# Patient Record
Sex: Male | Born: 1990 | Race: Black or African American | Hispanic: No | Marital: Single | Smoking: Never smoker
Health system: Southern US, Community
[De-identification: ages and names within clinical notes are randomized; demographics above are authoritative.]

## PROBLEM LIST (undated history)

## (undated) ENCOUNTER — Ambulatory Visit: Payer: Self-pay | Source: Home / Self Care

---
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
## 2011-06-09 ENCOUNTER — Emergency Department: Payer: Self-pay | Admitting: Internal Medicine

## 2012-04-12 ENCOUNTER — Emergency Department (HOSPITAL_COMMUNITY)
Admission: EM | Admit: 2012-04-12 | Discharge: 2012-04-12 | Disposition: A | Discharge status: Home / Self Care | Payer: Self-pay | Source: Home / Self Care | Attending: Family Medicine | Admitting: Family Medicine

## 2012-04-12 ENCOUNTER — Encounter (HOSPITAL_COMMUNITY): Payer: Self-pay | Admitting: Emergency Medicine

## 2012-04-12 DIAGNOSIS — K649 Unspecified hemorrhoids: Secondary | ICD-10-CM

## 2012-04-12 MED ORDER — HYDROCORTISONE ACE-PRAMOXINE 2.5-1 % RE CREA: TOPICAL_CREAM | Freq: Three times a day (TID) | RECTAL | Status: AC

## 2012-04-12 MED ORDER — POLYETHYLENE GLYCOL 3350 17 GM/SCOOP PO POWD: 17.0000 g | Freq: Every day | ORAL | Status: AC

## 2012-04-12 MED ORDER — PSYLLIUM 0.52 G PO CAPS: 0.5200 g | ORAL_CAPSULE | Freq: Every day | ORAL | Status: AC

## 2012-04-12 NOTE — ED Notes (Signed)
Pt c/o rectal bleeding since 9/13, no diarrhea or constipation. Pt denies injury.

## 2012-04-12 NOTE — ED Provider Notes (Signed)
History     CSN: [id number]  Arrival date & time 04/12/12  1517   First MD Initiated Contact with Patient 04/12/12 1531      Chief Complaint  Patient presents with  . Rectal Bleeding    (Consider location/radiation/quality/duration/timing/severity/associated sxs/prior treatment) HPI Comments: 21 year old male with no significant past medical history. Here complaining of rectal burning sensation and bleeding for 3 days. Denies straining or hard stools. He stated he started to see blood stained tissue paper after wiping. Denies abdominal pain nausea vomiting or diarrhea. Denies rectal trauma. States pain is 2-3/10. No family or personal history of Chron's or ulcerative colitis. No h/o heavy drinking or hepatic cirrhosis. Works loading and unloading trucks.      History reviewed. No pertinent past medical history.  History reviewed. No pertinent past surgical history.  History reviewed. No pertinent family history.  History  Substance Use Topics  . Smoking status: Never Smoker   . Smokeless tobacco: Not on file  . Alcohol Use:       Review of Systems  Constitutional: Negative for fever, chills and appetite change.       10 systems reviewed and  pertinent negative and positive symptoms are as per HPI.     Gastrointestinal: Positive for blood in stool, hematochezia and rectal pain. Negative for vomiting, abdominal pain, diarrhea and constipation.       As per HPI  Skin: Negative for rash.  Neurological: Negative for dizziness and headaches.  All other systems reviewed and are negative.    Allergies  Review of patient's allergies indicates no known allergies.  Home Medications   Current Outpatient Rx  Name Route Sig Dispense Refill  . HYDROCORTISONE ACE-PRAMOXINE 2.5-1 % RE CREA Rectal Place rectally 3 (three) times daily. 30 g 0  . POLYETHYLENE GLYCOL 3350 PO POWD Oral Take 17 g by mouth daily. 255 g 0  . PSYLLIUM 0.52 G PO CAPS Oral Take 1 capsule (0.52 g total) by  mouth daily. 30 capsule 0    BP 117/61  Pulse 60  Temp 98.6 F (37 C) (Oral)  Resp 17  SpO2 100%  Physical Exam  Nursing note and vitals reviewed. Constitutional: He is oriented to person, place, and time. He appears well-developed and well-nourished. No distress.  HENT:  Head: Normocephalic and atraumatic.  Eyes: No scleral icterus.  Cardiovascular: Normal heart sounds.   Pulmonary/Chest: Breath sounds normal.  Abdominal: Soft. He exhibits no distension. There is no tenderness.       No HSM  Genitourinary: Prostate normal. Rectal exam shows external hemorrhoid and tenderness. Rectal exam shows no internal hemorrhoid, no fissure, no mass and anal tone normal.       Rectal area: there is an inflamed external hemorrhoid at 7 o'clock level with signs of skin abrasion and obvious bleeding point with small superficial clot at the tip. Minimally tender to palpation. No hard consistency. Does not appear thrombosed.     Neurological: He is alert and oriented to person, place, and time.  Skin: No rash noted.    ED Course  Procedures (including critical care time)  Labs Reviewed - No data to display No results found.   1. Bleeding hemorrhoid       MDM  Abraded external hemorrhoid. Not thrombosed. Treated conservatively with pramoxine cream. MiraLax, psyllium and sitz bath. Supportive/preventive care instructions discussed with patient and provided in writing. Asked to return if worsening symptoms despite following treatment.        Sharley Keeler Moreno-Coll,  MD 04/14/12 2226

## 2012-04-19 ENCOUNTER — Emergency Department (INDEPENDENT_AMBULATORY_CARE_PROVIDER_SITE_OTHER)
Admission: EM | Admit: 2012-04-19 | Discharge: 2012-04-19 | Disposition: A | Discharge status: Home / Self Care | Payer: 59 | Source: Home / Self Care

## 2012-04-19 ENCOUNTER — Encounter (HOSPITAL_COMMUNITY): Payer: Self-pay | Admitting: Emergency Medicine

## 2012-04-19 DIAGNOSIS — K644 Residual hemorrhoidal skin tags: Secondary | ICD-10-CM

## 2012-04-19 NOTE — ED Notes (Addendum)
Pt in for recheck of hemorrhoids and needs a release note to return to work. Pt states that he is doing a lot better.

## 2012-04-19 NOTE — ED Provider Notes (Signed)
Medical screening examination/treatment/procedure(s) were performed by non-physician practitioner and as supervising physician I was immediately available for consultation/collaboration.  Ugochi Henzler   Saranya Harlin, MD 04/19/12 1239 

## 2012-04-19 NOTE — ED Provider Notes (Signed)
History     CSN: [id number]  Arrival date & time 04/19/12  1048   None     Chief Complaint  Patient presents with  . Follow-up    hemorrhoids    (Consider location/radiation/quality/duration/timing/severity/associated sxs/prior treatment) HPI Comments: 21 year old male presents for followup for external hemorrhoids. He was seen at the urgent care center here one week ago. He was prescribed topical treatment and oral Colace. He states he is feeling much better much less discomfort and pain and only trace, scant intermittent bleeding.   History reviewed. No pertinent past medical history.  History reviewed. No pertinent past surgical history.  History reviewed. No pertinent family history.  History  Substance Use Topics  . Smoking status: Never Smoker   . Smokeless tobacco: Not on file  . Alcohol Use:       Review of Systems  Constitutional: Negative.   Respiratory: Negative.   Gastrointestinal: Positive for vomiting and anal bleeding. Negative for nausea, abdominal pain, diarrhea and rectal pain.  Genitourinary: Negative.   Musculoskeletal: Negative.        As per HPI  Skin: Negative.   Neurological: Negative for dizziness, weakness, numbness and headaches.    Allergies  Review of patient's allergies indicates no known allergies.  Home Medications   Current Outpatient Rx  Name Route Sig Dispense Refill  . HYDROCORTISONE ACE-PRAMOXINE 2.5-1 % RE CREA Rectal Place rectally 3 (three) times daily. 30 g 0  . PSYLLIUM 0.52 G PO CAPS Oral Take 1 capsule (0.52 g total) by mouth daily. 30 capsule 0    BP 122/72  Pulse 65  Temp 98.7 F (37.1 C) (Oral)  Resp 17  SpO2 98%  Physical Exam  Constitutional: He is oriented to person, place, and time. He appears well-developed and well-nourished.  HENT:  Head: Normocephalic and atraumatic.  Eyes: EOM are normal. Left eye exhibits no discharge.  Neck: Normal range of motion. Neck supple.  Genitourinary:       Anal  examination reveals a small shrinking external hemorrhoid. No evidence of bleeding. It is not obstructing the anal outlet. Is not tense.  Musculoskeletal: He exhibits no edema and no tenderness.  Neurological: He is alert and oriented to person, place, and time. No cranial nerve deficit.  Skin: Skin is warm and dry.  Psychiatric: He has a normal mood and affect.    ED Course  Procedures (including critical care time)  Labs Reviewed - No data to display No results found.   1. External hemorrhoid       MDM  Continue to use the oral and topical medications previously prescribed.  No straining. 3 more days out of work.          Hayden Rasmussen, NP 04/19/12 1238

## 2012-06-07 ENCOUNTER — Emergency Department (HOSPITAL_COMMUNITY)
Admission: EM | Admit: 2012-06-07 | Discharge: 2012-06-07 | Disposition: A | Discharge status: Home / Self Care | Payer: 59 | Source: Home / Self Care | Attending: Family Medicine | Admitting: Family Medicine

## 2012-06-07 ENCOUNTER — Encounter (HOSPITAL_COMMUNITY): Payer: Self-pay | Admitting: Emergency Medicine

## 2012-06-07 DIAGNOSIS — M65839 Other synovitis and tenosynovitis, unspecified forearm: Secondary | ICD-10-CM

## 2012-06-07 DIAGNOSIS — M778 Other enthesopathies, not elsewhere classified: Secondary | ICD-10-CM

## 2012-06-07 MED ORDER — DICLOFENAC POTASSIUM 50 MG PO TABS: 50.0000 mg | ORAL_TABLET | Freq: Three times a day (TID) | ORAL | Status: DC

## 2012-06-07 NOTE — ED Notes (Signed)
Pt states that works at [employer] and does a lot of lifting boxes 20+ pounds.   C/o left wrist pain that is an achy sensation that radiates from base of wrist up thru out fingers.   Symptoms started on 10/20 ; gradual on set

## 2012-06-07 NOTE — ED Provider Notes (Signed)
History     CSN: [id number]  Arrival date & time 06/07/12  1002   First MD Initiated Contact with Patient 06/07/12 1003      Chief Complaint  Patient presents with  . Wrist Pain    constant achy feeling radiating from wrist thru out fingers; left wrist    (Consider location/radiation/quality/duration/timing/severity/associated sxs/prior treatment) Patient is a 21 y.o. male presenting with wrist pain. The history is provided by the patient.  Wrist Pain This is a new problem. The current episode started more than 2 days ago. The problem has not changed since onset.Associated symptoms comments: Unloads boxes at work, repetitively.. The symptoms are aggravated by twisting and bending.    History reviewed. No pertinent past medical history.  History reviewed. No pertinent past surgical history.  History reviewed. No pertinent family history.  History  Substance Use Topics  . Smoking status: Never Smoker   . Smokeless tobacco: Not on file  . Alcohol Use:       Review of Systems  Constitutional: Negative.   Musculoskeletal: Positive for arthralgias. Negative for myalgias and joint swelling.    Allergies  Review of patient's allergies indicates no known allergies.  Home Medications   Current Outpatient Rx  Name  Route  Sig  Dispense  Refill  . DICLOFENAC POTASSIUM 50 MG PO TABS   Oral   Take 1 tablet (50 mg total) by mouth 3 (three) times daily.   30 tablet   0   . PSYLLIUM 0.52 G PO CAPS   Oral   Take 1 capsule (0.52 g total) by mouth daily.   30 capsule   0     BP 127/73  Pulse 62  Temp 98.6 F (37 C) (Oral)  Resp 17  SpO2 95%  Physical Exam  Nursing note and vitals reviewed. Constitutional: He is oriented to person, place, and time. He appears well-developed and well-nourished.  Musculoskeletal: He exhibits tenderness.       Arms: Neurological: He is alert and oriented to person, place, and time.  Skin: Skin is warm and dry.    ED Course    Procedures (including critical care time)  Labs Reviewed - No data to display No results found.   1. Tendonitis of wrist, left       MDM          Linna Hoff, MD 06/07/12 1029

## 2012-06-17 ENCOUNTER — Encounter (HOSPITAL_COMMUNITY): Payer: Self-pay | Admitting: *Deleted

## 2012-06-17 ENCOUNTER — Emergency Department (HOSPITAL_COMMUNITY)
Admission: EM | Admit: 2012-06-17 | Discharge: 2012-06-17 | Disposition: A | Discharge status: Home / Self Care | Payer: 59 | Source: Home / Self Care | Attending: Family Medicine | Admitting: Family Medicine

## 2012-06-17 DIAGNOSIS — M65849 Other synovitis and tenosynovitis, unspecified hand: Secondary | ICD-10-CM

## 2012-06-17 DIAGNOSIS — M778 Other enthesopathies, not elsewhere classified: Secondary | ICD-10-CM

## 2012-06-17 MED ORDER — DICLOFENAC POTASSIUM 50 MG PO TABS: 50.0000 mg | ORAL_TABLET | Freq: Three times a day (TID) | ORAL | Status: DC

## 2012-06-17 NOTE — ED Notes (Signed)
Pt  Reports  Symptoms  Of  r  Wrist  Pain  For  sev  Weeks        He  denys  specefic injury  Yet  Reports  Pain is  Worse  On  Certain movements  And  Or  posistion

## 2012-06-17 NOTE — ED Provider Notes (Signed)
History     CSN: [id number]  Arrival date & time 06/17/12  4098   First MD Initiated Contact with Patient 06/17/12 [phone]      Chief Complaint  Patient presents with  . Wrist Pain    (Consider location/radiation/quality/duration/timing/severity/associated sxs/prior treatment) HPI Comments: 21 year old right-handed male with history of left hand and wrist tendinitis diagnosed 10 days ago here. Comes complaining of similar symptoms on the right hand. Patient described pain in the back of his wrist over the base of the right thumb. 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 Had diclofenac prescribed for similar symptoms in the left hand. Denies taking this medication in the last 2 days. Has used a wrist splint on that left hand with improvement. Not using a wrist brace or splint on the right hand currently. Denies fever or chills. No other joints Involvement. Denies joint swelling, no skin discoloration of the hands or at the tip of his fingers.    History reviewed. No pertinent past medical history.  History reviewed. No pertinent past surgical history.  No family history on file.  History  Substance Use Topics  . Smoking status: Never Smoker   . Smokeless tobacco: Not on file  . Alcohol Use: No      Review of Systems  Constitutional: Negative for fever and chills.  Musculoskeletal: Negative for myalgias, joint swelling and arthralgias.       Wrist pain as per HPI  Skin: Negative for color change and rash.  All other systems reviewed and are negative.    Allergies  Review of patient's allergies indicates no known allergies.  Home Medications   Current Outpatient Rx  Name  Route  Sig  Dispense  Refill  . DICLOFENAC POTASSIUM 50 MG PO TABS   Oral   Take 1 tablet (50 mg total) by mouth 3 (three) times daily.   30 tablet   0   . PSYLLIUM 0.52 G PO CAPS   Oral   Take 1 capsule (0.52 g total) by mouth daily.   30 capsule   0      BP 145/79  Pulse 58  Temp 98.5 F (36.9 C) (Oral)  Resp 16  SpO2 100%  Physical Exam  Nursing note and vitals reviewed. Constitutional: He is oriented to person, place, and time. He appears well-developed and well-nourished. No distress.  HENT:  Head: Normocephalic and atraumatic.  Eyes: Conjunctivae normal are normal. No scleral icterus.  Neck: No thyromegaly present.  Cardiovascular: Normal heart sounds.   Pulmonary/Chest: Breath sounds normal.  Musculoskeletal:       Right wrist: No obvious deformity, swelling or erythema. Patient able to make a fist, abduct and adduct digits with reported minimal discomfort. Reported pain worse with  thumb opposition to other digits. Tenderness over the radial dorsal area at base of the thumb. No tenderness to percussion over volar wrist surface. Intact 2 point discrimination in the dorsal and palmar aspect of the hand and fingers. Phalen's test. Positive Finkelstain's test. Patent radial and ulnar pulses with brisk cap refill at the tip of the fingers. Patient reported pain with hand grip. Strength impressed normal despite discomfort.    Neurological: He is alert and oriented to person, place, and time.  Skin: No rash noted. He is not diaphoretic. No erythema. No pallor.    ED Course  Procedures (including critical care time)  Labs Reviewed - No data to display No results found.  1. Tendinitis of right wrist       MDM  Impress Quervain's tenosynovitis. Refilled diclofenac. Placed on a right wrist splint with thumb spica. Recommended rehabilitation exercises. Asked to followup with sports medicine if persistent or improving symptoms despite following treatment.        Sharin Grave, MD 06/19/12 1427

## 2013-08-26 ENCOUNTER — Emergency Department (INDEPENDENT_AMBULATORY_CARE_PROVIDER_SITE_OTHER): Payer: 59

## 2013-08-26 ENCOUNTER — Encounter (HOSPITAL_COMMUNITY): Payer: Self-pay | Admitting: Emergency Medicine

## 2013-08-26 ENCOUNTER — Emergency Department (HOSPITAL_COMMUNITY)
Admission: EM | Admit: 2013-08-26 | Discharge: 2013-08-26 | Disposition: A | Discharge status: Home / Self Care | Payer: 59 | Source: Home / Self Care | Attending: Emergency Medicine | Admitting: Emergency Medicine

## 2013-08-26 DIAGNOSIS — S8390XA Sprain of unspecified site of unspecified knee, initial encounter: Secondary | ICD-10-CM

## 2013-08-26 DIAGNOSIS — IMO0002 Reserved for concepts with insufficient information to code with codable children: Secondary | ICD-10-CM

## 2013-08-26 DIAGNOSIS — S93409A Sprain of unspecified ligament of unspecified ankle, initial encounter: Secondary | ICD-10-CM

## 2013-08-26 MED ORDER — HYDROCODONE-ACETAMINOPHEN 5-325 MG PO TABS: ORAL_TABLET | ORAL | Status: DC

## 2013-08-26 MED ORDER — MELOXICAM 15 MG PO TABS: 15.0000 mg | ORAL_TABLET | Freq: Every day | ORAL | Status: DC

## 2013-08-26 MED ORDER — HYDROCODONE-ACETAMINOPHEN 5-325 MG PO TABS
2.0000 | ORAL_TABLET | Freq: Once | ORAL | Status: AC
  Administered 2013-08-26: 2 via ORAL

## 2013-08-26 MED ORDER — HYDROCODONE-ACETAMINOPHEN 5-325 MG PO TABS
ORAL_TABLET | ORAL | Status: AC
  Filled 2013-08-26: qty 2

## 2013-08-26 NOTE — Discharge Instructions (Signed)
An ankle sprain is a tear or stretch to one or more of the ligaments that surround and support the ankle joint.  This can take time to improve, but by following some of the hints below, you can speed the healing time. ° °For the first 72 hours, follow the principles of RICE (Rest, Ice, Compression, and Elevation). °· Rest--Stay off the ankle as much as possible.  If you have been provided with crutches, use them. °· Ice--Apply ice (crushed ice in a zip lock bag, frozen peas or corn, or one of the commercial blue gel ice packs that you put in the freezer).  Apply every 2 or 3 hours if possible, while awake.  If you are wearing a brace or splint, you may apply the ice right over the splint.  If you do not have a splint or brace, place a moist washcloth or towel between the ice and your skin to avoid damage to your skin.  You may leave the ice in place for 10 to 15 minutes.  Alternatively, you can do ice massage by filling a paper cup half full of water, inserting a popsicle stick and freezing it.  This gives you a chunk of ice on a stick with which you can massage the painful area. °· Compression--If you have been provided with an ankle brace, wear it whenever you are on your feet.  This means you can take it off at night when you sleep or when you shower or bathe.  Otherwise, it should be on your ankle.  If you were not given a brace, you should wear a 3 or 4 inch Ace wrap.  Wind it around your ankle in a figure 8 pattern at full stretch.  You may need to rewrap the ankle several times a day to keep the pressure up.  If your toes feel numb, painful, or look pale or blue, loosen it up a little since this could be a sign of circulatory impairment.  An Ace wrap loses its elasticity after 3 or 4 days of wear, so you will need to replace it. °· Elevation--Elevate your ankle above heart level every chance you get.  This means propping it up on several pillows. ° °Use of over the counter pain meds can be of help.  Tylenol  (or acetaminophen) is the safest to use.  It often helps to take this regularly.  You can take up to 2 325 mg tablets 5 times daily, but it best to start out much lower that that, perhaps 2 325 mg tablets twice daily, then increase from there. People who are on the blood thinner warfarin have to be careful about taking high doses of Tylenol.  For people who are able to tolerate them, ibuprofen and naproxyn can also help with the pain.  You should discuss these agents with your physician before taking them.  People with chronic kidney disease, hypertension, peptic ulcer disease, and reflux can suffer adverse side effects. They should not be taken with warfarin. The maximum dosage of ibuprofen is 800 mg 3 times daily with meals.  The maximum dosage of naprosyn is 2 and 1/2 tablets twice daily with food, but again, start out low and gradually increase the dose until adequate pain relief is achieved. Ibuprofen and naprosyn should always be taken with food. ° °After the first 72 hours, it's time to start mobilizing the ankle.  Ankle sprains heal quicker with gentle and gradual remobilization.  You may now begin to bear more   weight as pain allows.  If you are using crutches, you may begin to wean off the crutches if you can.  Don't start to do any strenuous exercises such as running or sports until cleared by a physician.  It is best to start out with some simple exercises such as those described below and then gradually build up.  Do the exercises twice daily followed by ice for 10 minutes.  Continue to wear your brace or Ace wrap until the ankle has completely healed plus one or 2 more weeks. ° ° °ANKLE CIRCLES °  °Sit on the floor with your legs in front of you. Move your ankle from side to side, up and down, and around in circles. Do five to 10 circles in each direction at least three times a day. ° °ALPHABET LETTERS °  °Using your big toe as a “pencil”, try to write the letters of the alphabet in the air. Do the  entire alphabet two or three times. ° °TOE RAISES °  °Pull your toes back toward you while keeping your knee as straight as you can. Hold for 15 seconds. Do this 10 times. ° °HEEL RAISES °  °Point your toes away from you while keeping your knee as straight as you can. Hold for 15 seconds. Do this 10 times. ° °IN AND OUT °  °Turn your foot inward until you can't turn it anymore and hold for 15 seconds. Straighten your leg again. Turn it outward until you can't turn it anymore and hold for 15 seconds. Do this 10 times in both directions. ° °RESISTED IN AND OUT °  °Sit on a chair with your leg straight in front of you. Tie a large elastic exercise band together at one end to make a knot. Wrap the knot end of the band around a chair leg and the other end around the bottom of your injured foot. Keep your heel on the ground and slide your foot outward and hold for 10 seconds. Put your foot in front of you again. Slide your foot inward and hold for 10 seconds. Repeat at least 10 times each direction two or three times a day. ° °STEP-UP °  °Put your injured foot on the first step of a staircase and your uninjured foot on the ground. Slowly straighten the knee of your injured leg while lifting your uninjured foot off of the ground. Slowly put your uninjured foot back on the ground. Do this three to five times at least three times a day. ° °SITTING AND STANDING HEEL RAISES °  °Sit in a chair with your injured foot on the ground. Slowly raise the heel of your injured foot while keeping your toes on the ground. Return the heel to the floor. Repeat 10 times at least two or three times a day. As you get stronger, you can stand on your injured foot instead of sitting in a chair and raise the heel. Your uninjured foot should always stay on the ground. ° °BALANCE EXERCISES °  °Stand and place a chair next to your uninjured leg to balance you. At first, stand on the injured foot for only 30 seconds. You can slowly increase this to up  to three minutes at a time. Repeat at least three times a day. For more difficulty, repeat with your eyes closed. ° °You will need to continue these exercises until your ankle is completely pain free, then for 1 to 2 more weeks.  If your ankle does not   seem to be making much progress after 2 weeks, it is best to return to have it rechecked, although an ankle sprain can take up to 6 weeks to heal. ° ° ° °Knee pain can be caused by many conditions:  Osteoarthritis, gout, bursitis, tendonitis, cartiledge damage, condromalacia patella, patellofemoral syndrome, and ligament sprain to name just a few.  Often some simple conservative measures can help alleviate the pain. ° °Do not do the following: °· Avoid squatting and doing deep knee bends.  This puts too much of load on your cartiledges and tendons.  If you do a knee bend, go only half way down, flexing your knee no more than 90 degrees. ° °Do the following: °· If you are overweight or obese, lose weight.  This makes for a lot less load on your knee joints. °· If you use tobacco, quit.  Nicotine causes spasm of the small arteries, decreases blood flow, and impairs your body's normal ability to repair damage. °· If your knee is acutely inflamed, use the principles of RICE (rest, ice, compression, and elevation). °· Wearing a knee brace can help.  These are usually made of neoprene and can be purchased over the counter at the drug store. °· Use of over the counter pain meds can be of help.  Tylenol (or acetaminophen) is the safest to use.  It often helps to take this regularly.  You can take up to 2 325 mg tablets 5 times daily, but it best to start out much lower that that, perhaps 2 325 mg tablets twice daily, then increase from there. People who are on the blood thinner warfarin have to be careful about taking high doses of Tylenol.  For people who are able to tolerate them, ibuprofen and naproxyn can also help with the pain.  You should discuss these agents with your  physician before taking them.  People with chronic kidney disease, hypertension, peptic ulcer disease, and reflux can suffer adverse side effects. They should not be taken with warfarin. The maximum dosage of ibuprofen is 800 mg 3 times daily with meals.  The maximum dosage of naprosyn is 2 and 1/2 tablets twice daily with food, but again, start out low and gradually increase the dose until adequate pain relief is achieved. Ibuprofen and naprosyn should always be taken with food. °· People with cartiledge injury or osteoarthritis may find glucosamine to be helpful.  This is an over-the-counter supplement that helps nourish and repair cartiledge.  The dose is 500 mg 3 times daily or 1500 mg taken in a single dose. This can take several months to work and it doesn't always work.   °· For people with knee pain on just one side, use of a cane held in the hand on the same side as the knee pain takes some of the stress off the knee joint and can make a big difference in knee pain. °· Wearing good shoes with adequate arch support is essential. °· Regular exercise is of utmost importance.  Swimming, water aerobics, or use of an elliptical exerciser put the least stress on the knees of any exercise. °· Finally doing the exercises below can be very helpful.  They tend to strengthen the muscles around the knee and provide extra support and stability.  Try to do them twice a day followed by ice for 10 minutes. ° ° ° ° ° ° °

## 2013-08-26 NOTE — ED Notes (Signed)
Left knee and left ankle pain since playing basketball yesterday.  Patient reports he jumped up and when coming back down, fell, landing on his leg bent under him .  Reports left knee pain full in certain positions, ankle painful with weight bearing.  Instructed patient to remove sweat pants, socks, shoes and put on gown, provided sheet all for physician exam

## 2013-08-26 NOTE — ED Provider Notes (Signed)
Chief Complaint    Chief Complaint  Patient presents with  . Knee Pain  . Ankle Pain    History of Present Illness     Craig Simmons is a 23 year old male who was playing basketball yesterday when he came down wrong on his left leg, twisting his left knee and his ankle. He's had pain over the lateral knee joint line or lateral ankle since then and some swelling of the ankle but not of the knee. It hurts to bear weight, to flex or extend the knee or the ankle. He's had some numbness and tingling in the foot. He can bear little bit of weight.  Review of Systems     Other than as noted above, the patient denies any of the following symptoms: Systemic:  No fevers, chills, sweats, or muscle aches.  No weight loss.  Musculoskeletal:  No joint pain, arthritis, bursitis, swelling, back pain, or neck pain. Neurological:  No muscular weakness, paresthesias, headache, or trouble with speech or coordination.  No dizziness.  PMFSH    Past medical history, family history, social history, meds, and allergies were reviewed.    Physical Exam    Vital signs:  BP 124/67  Pulse 57  Temp(Src) 98 F (36.7 C) (Oral)  Resp 16  SpO2 100% Gen:  Alert and oriented times 3.  In no distress. Musculoskeletal: Exam of the knee reveals no pain to palpation, full range of motion of the knee, no crepitus, McMurray sign was negative, cruciate and collateral ligaments were intact. Exam of the ankle reveals swelling and pain to palpation over the lateral malleolus. Drawer sign was negative, talar tilt sign was negative, squeeze test was negative, ankle has a full range of motion with pain.  Otherwise, all joints had a full a ROM with no swelling, bruising or deformity.  No edema, pulses full. Extremities were warm and pink.  Capillary refill was brisk.  Skin:  Clear, warm and dry.  No rash. Neuro:  Alert and oriented times 3.  Muscle strength was normal.  Sensation was intact to light touch.   Radiology     Dg  Ankle Complete Left  08/26/2013   CLINICAL DATA:  Left lateral ankle pain  EXAM: LEFT ANKLE COMPLETE - 3+ VIEW  COMPARISON:  None.  FINDINGS: There is no evidence of fracture, dislocation, or joint effusion. There is no evidence of arthropathy or other focal bone abnormality. Soft tissues are unremarkable.  IMPRESSION: Negative.   Electronically Signed   By: Elige KoHetal  Patel   On: 08/26/2013 16:59   Dg Knee Complete 4 Views Left  08/26/2013   CLINICAL DATA:  Basketball injury.  Lateral knee pain.  EXAM: LEFT KNEE - COMPLETE 4+ VIEW  COMPARISON:  None.  FINDINGS: There is no evidence of fracture, dislocation, or joint effusion. There is no evidence of arthropathy or other focal bone abnormality. Soft tissues are unremarkable.  IMPRESSION: Negative.   Electronically Signed   By: Sebastian AcheAllen  Grady   On: 08/26/2013 17:07   I reviewed the images independently and personally and concur with the radiologist's findings.  [facility]   Was given a knee sleeve and an ASO brace.  Assessment    The primary encounter diagnosis was Ankle sprain. A diagnosis of Knee sprain was also pertinent to this visit.  Plan   1.  Meds:  The following meds were prescribed:   New Prescriptions   HYDROCODONE-ACETAMINOPHEN (NORCO/VICODIN) 5-325 MG PER TABLET    1  to 2 tabs every 4 to 6 hours as needed for pain.   MELOXICAM (MOBIC) 15 MG TABLET    Take 1 tablet (15 mg total) by mouth daily.    2.  Patient Education/Counseling:  The patient was given appropriate handouts, self care instructions, and instructed in symptomatic relief, including rest and activity, elevation, application of ice and compression.  No work for the next 3 days. Thereafter to start exercising. If no better in 2 weeks, followup with sports medicine.  3.  Follow up:  The patient was told to follow up here if no better in 3 to 4 days, or sooner if becoming worse in any way, and given some red flag symptoms such as worsening pain or new  neurological symptoms which would prompt immediate return.  Follow up here as needed.     Reuben Likes, MD 08/26/13 [phone]

## 2013-08-26 NOTE — ED Notes (Signed)
Patient transported to X-ray 

## 2015-09-18 IMAGING — CR DG KNEE COMPLETE 4+V*L*
4 series · 4 of 4 positions shown · non-contrast
Comparison: None.

CLINICAL DATA: Basketball injury.  Lateral knee pain.

EXAM:
LEFT KNEE - COMPLETE 4+ VIEW

[view not recorded (1 of 4)]
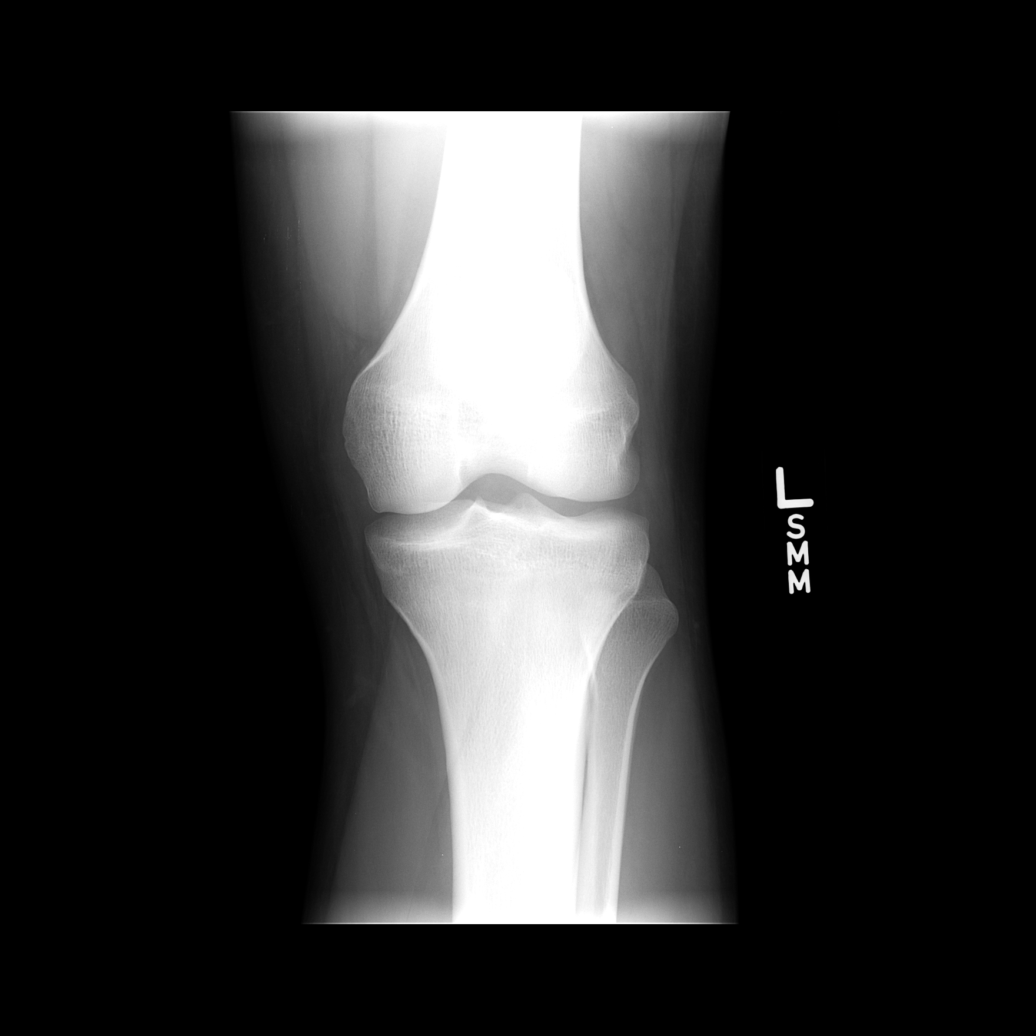

[view not recorded (2 of 4)]
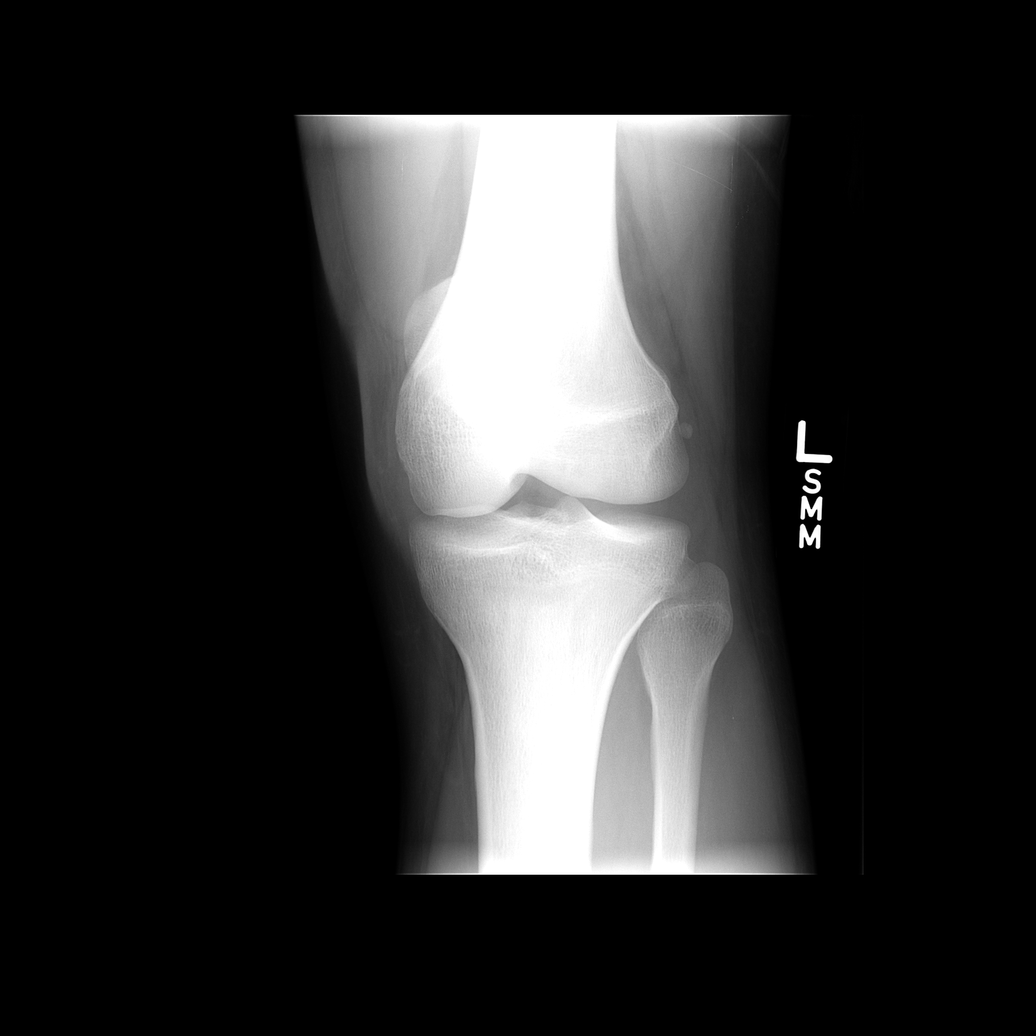

[view not recorded (3 of 4)]
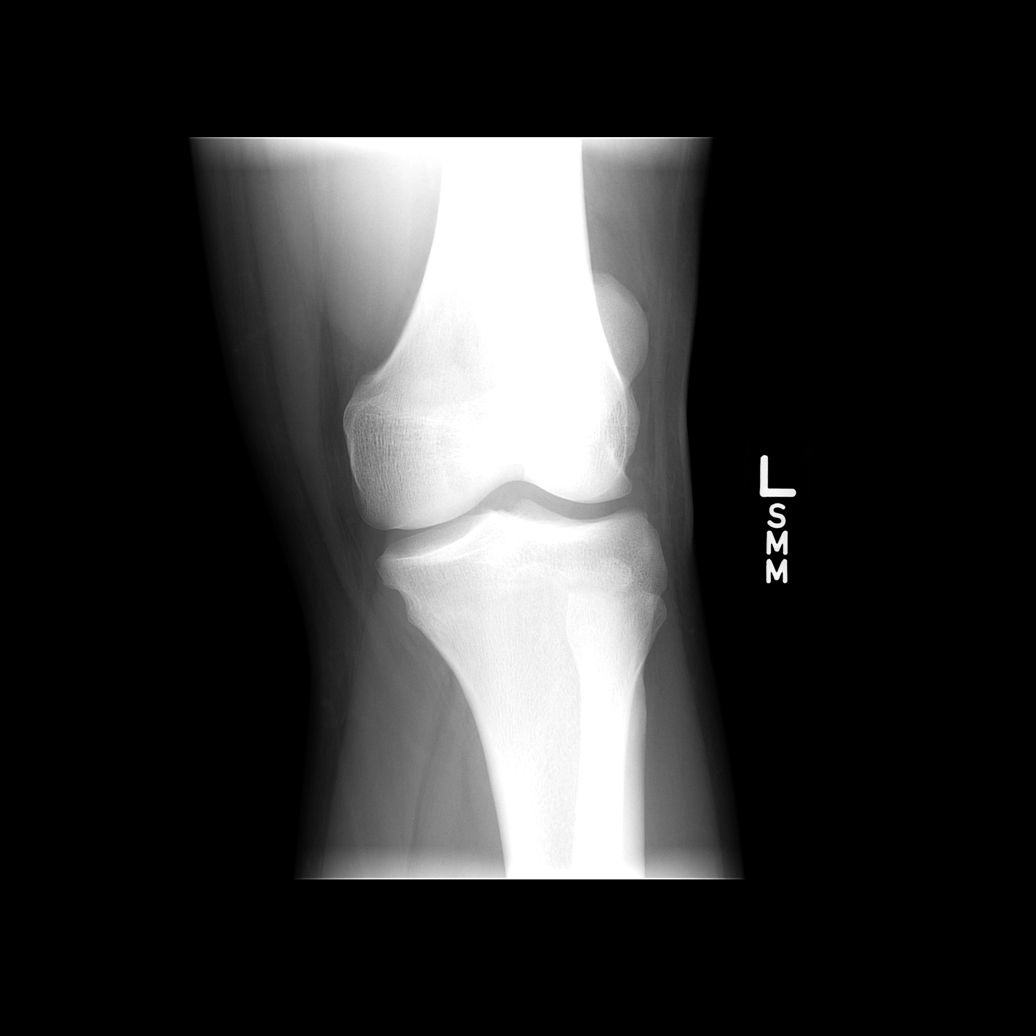

[view not recorded (4 of 4)]
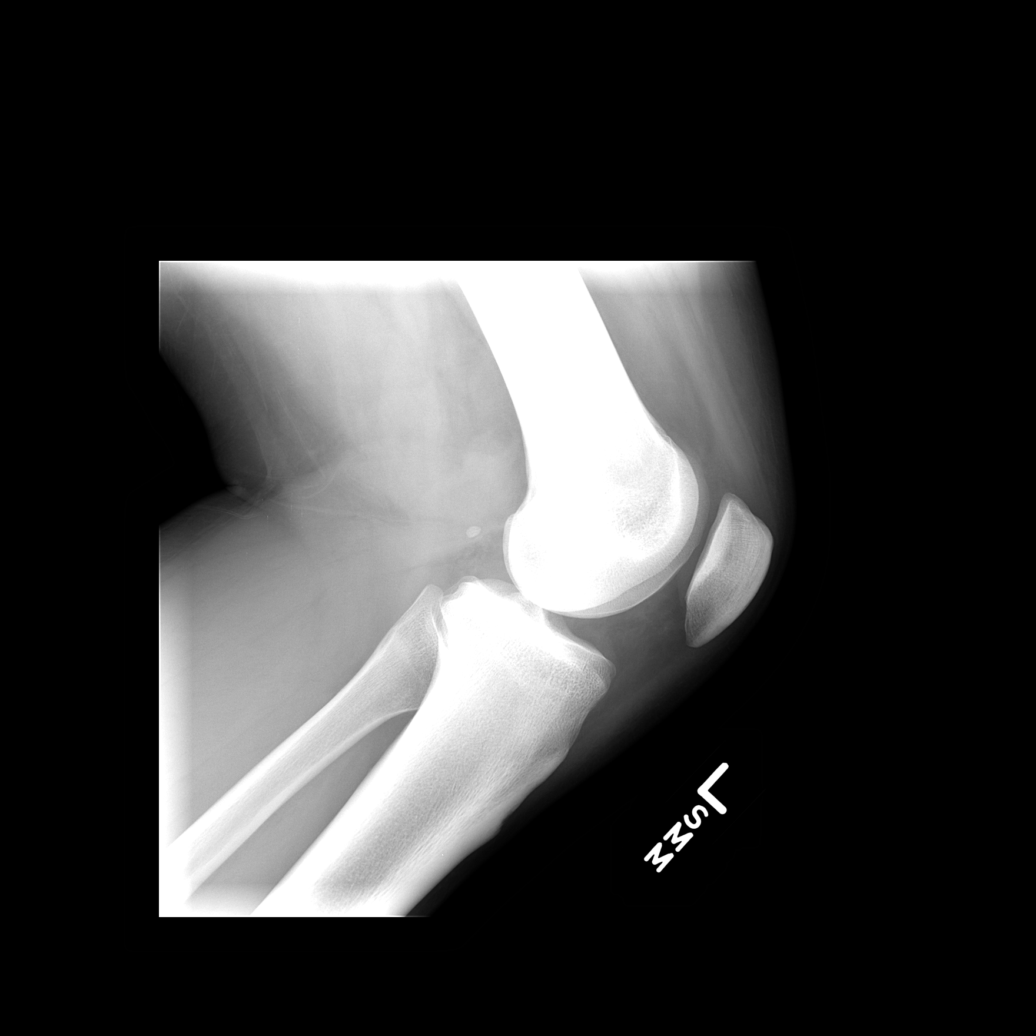

[4 of 4 positions shown; findings below may reference images not displayed]

FINDINGS: There is no evidence of fracture, dislocation, or joint effusion.
There is no evidence of arthropathy or other focal bone abnormality.
Soft tissues are unremarkable.
IMPRESSION: Negative.

## 2018-10-21 ENCOUNTER — Other Ambulatory Visit: Payer: Self-pay

## 2018-10-21 ENCOUNTER — Encounter (HOSPITAL_COMMUNITY): Payer: Self-pay | Admitting: Emergency Medicine

## 2018-10-21 ENCOUNTER — Ambulatory Visit (HOSPITAL_COMMUNITY)
Admission: EM | Admit: 2018-10-21 | Discharge: 2018-10-21 | Disposition: A | Discharge status: Home / Self Care | Payer: Managed Care, Other (non HMO) | Attending: Family Medicine | Admitting: Family Medicine

## 2018-10-21 DIAGNOSIS — B9789 Other viral agents as the cause of diseases classified elsewhere: Secondary | ICD-10-CM

## 2018-10-21 DIAGNOSIS — J069 Acute upper respiratory infection, unspecified: Secondary | ICD-10-CM

## 2018-10-21 MED ORDER — BENZONATATE 100 MG PO CAPS: ORAL_CAPSULE | ORAL | 0 refills | Status: DC

## 2018-10-21 NOTE — Discharge Instructions (Signed)
Follow up with your primary care doctor or here if you are not seeing improvement of your symptoms over the next several days, sooner if you feel you are worsening.  Caring for yourself: Get plenty of rest. Drink plenty of fluids, enough so that your urine is light yellow or clear like water. If you have kidney, heart, or liver disease and have to limit fluids, talk with your doctor before you increase the amount of fluids you drink. Take an over-the-counter pain medicine if needed, such as acetaminophen (Tylenol), ibuprofen (Advil, Motrin), or naproxen (Aleve), to relieve fever, headache, and muscle aches. Read and follow all instructions on the label. No one younger than 20 should take aspirin. It has been linked to Reye syndrome, a serious illness. Before you use over the counter cough and cold medicines, check the label. These medicines may not be safe for children younger than age 75 or for people with certain health problems. If the skin around your nose and lips becomes sore, put some petroleum jelly on the area.  Avoid spreading a respiratory virus or the flu: Wash your hands regularly, and keep your hands away from your face.  Stay home from school, work, and other public places until you are feeling better and your fever has been gone for at least 24 hours. The fever needs to have gone away on its own without the help of medicine.

## 2018-10-21 NOTE — ED Provider Notes (Signed)
[facility]   [id number] 10/21/18 Arrival Time: 1157  ASSESSMENT & PLAN:  1. Viral URI with cough    See AVS for discharge instructions. [facility] COVID-19 written information given.  Discussed typical duration of symptoms. OTC symptom care as needed. Ensure adequate fluid intake and rest. May f/u with PCP or here as needed.  Greater than  Reviewed expectations re: course of current medical issues. Questions answered. Outlined signs and symptoms indicating need for more acute intervention. Patient verbalized understanding. After Visit Summary given.   SUBJECTIVE: History from: patient.  Craig Simmons is a 28 y.o. male who presents with complaint of nasal congestion, post-nasal drainage, and a persistent dry cough; without sore throat. Onset abrupt, 3-4 days ago; with fatigue and with body aches. SOB: none. Wheezing: none. Fever: yes, up to 100 degrees F. Overall normal PO intake without n/v. Known sick contacts: no. No specific or significant aggravating or alleviating factors reported. Mild and generalized headache for the past 1-2 days; not worsening; not the worst headache of his life. OTC treatment: Tylenol for fever reduction.  Social History   Tobacco Use  Smoking Status Never Smoker  Smokeless Tobacco Never Used   ROS: As per HPI.  OBJECTIVE:  Vitals:   10/21/18 1230  BP: 134/87  Pulse: 69  Resp: 12  Temp: 99.1 F (37.3 C)  TempSrc: Oral  SpO2: 100%     General appearance: alert; appears fatigued HEENT: nasal congestion; clear runny nose; throat irritation secondary to post-nasal drainage Neck: supple without LAD CV: RRR Lungs: unlabored respirations, symmetrical air entry without wheezing; cough: mild Abd: soft Ext: no LE edema Skin: warm and dry Psychological: alert and cooperative; normal mood and affect   No Known Allergies  PMH: No lung problems.  Social History   Socioeconomic History  . Marital status: Single    Spouse  name: Not on file  . Number of children: Not on file  . Years of education: Not on file  . Highest education level: Not on file  Occupational History  . Not on file  Social Needs  . Financial resource strain: Not on file  . Food insecurity:    Worry: Not on file    Inability: Not on file  . Transportation needs:    Medical: Not on file    Non-medical: Not on file  Tobacco Use  . Smoking status: Never Smoker  . Smokeless tobacco: Never Used  Substance and Sexual Activity  . Alcohol use: No  . Drug use: No  . Sexual activity: Not on file  Lifestyle  . Physical activity:    Days per week: Not on file    Minutes per session: Not on file  . Stress: Not on file  Relationships  . Social connections:    Talks on phone: Not on file    Gets together: Not on file    Attends religious service: Not on file    Active member of club or organization: Not on file    Attends meetings of clubs or organizations: Not on file    Relationship status: Not on file  . Intimate partner violence:    Fear of current or ex partner: Not on file    Emotionally abused: Not on file    Physically abused: Not on file    Forced sexual activity: Not on file  Other Topics Concern  . Not on file  Social History Narrative  . Not on file  Mardella Layman, MD 10/26/18 [phone]

## 2018-10-21 NOTE — ED Triage Notes (Signed)
Pt reports a cough and nasal drainage that started 4 days ago.  He haws been taking an allergy pill for his symptoms.  He states he had a fever of 100. Something the next day, but he doesn't remember what it was.  He took tylenol for the headache and fever.  Pt is here today for continued cough and headache.  Pt is afebrile today with a dose of Tylenol at 0730 this morning.  Pt also reports some ringing in his ears at the onset of his symptoms.

## 2020-05-03 ENCOUNTER — Ambulatory Visit: Payer: Self-pay | Admitting: Registered Nurse

## 2023-05-24 ENCOUNTER — Ambulatory Visit
Admission: EM | Admit: 2023-05-24 | Discharge: 2023-05-24 | Disposition: A | Discharge status: Home / Self Care | Payer: Managed Care, Other (non HMO)

## 2023-05-24 ENCOUNTER — Other Ambulatory Visit: Payer: Self-pay

## 2023-05-24 DIAGNOSIS — L600 Ingrowing nail: Secondary | ICD-10-CM

## 2023-05-24 DIAGNOSIS — L03032 Cellulitis of left toe: Secondary | ICD-10-CM

## 2023-05-24 MED ORDER — DOXYCYCLINE HYCLATE 100 MG PO CAPS: 100.0000 mg | ORAL_CAPSULE | Freq: Two times a day (BID) | ORAL | 0 refills | Status: DC

## 2023-05-24 NOTE — ED Triage Notes (Signed)
"  I have a wound on my big toe, I thought it was an ingrown toenail but it me be infected, swelling, drainage". No fever. First noticed "last week".

## 2023-06-21 ENCOUNTER — Encounter: Payer: Self-pay | Admitting: Physician Assistant

## 2023-06-21 NOTE — ED Provider Notes (Signed)
[facility]    CSN: [id number] Arrival date & time: 05/24/23  0836      History   Chief Complaint Chief Complaint  Patient presents with   Foot Pain    HPI Craig Simmons is a 32 y.o. male.   Patient here today for pain and swelling to his left big toe that started about a week ago.  He reports that he has had some drainage from same.  He reports he initially thought might be an ingrown toenail.  He denies any fever.  He does not report treatment.  The history is provided by the patient.  Foot Pain Pertinent negatives include no shortness of breath.    History reviewed. No pertinent past medical history.  There are no problems to display for this patient.   History reviewed. No pertinent surgical history.     Home Medications    Prior to Admission medications   Medication Sig Start Date End Date Taking? Authorizing Provider  doxycycline (VIBRAMYCIN) 100 MG capsule Take 1 capsule (100 mg total) by mouth 2 (two) times daily. 05/24/23  Yes Tomi Bamberger, PA-C  acetaminophen (TYLENOL) 325 MG tablet Take 650 mg by mouth every 6 (six) hours as needed for headache.    [provider]  benzonatate (TESSALON) 100 MG capsule Take 1 capsule by mouth every 8 (eight) hours for cough. 10/21/18   Mardella Layman, MD  diclofenac (CATAFLAM) 50 MG tablet Take 1 tablet (50 mg total) by mouth 3 (three) times daily. 06/17/12   Moreno-Coll, Adlih, MD  HYDROcodone-acetaminophen (NORCO/VICODIN) 5-325 MG per tablet 1 to 2 tabs every 4 to 6 hours as needed for pain. 08/26/13   Reuben Likes, MD  meloxicam (MOBIC) 15 MG tablet Take 1 tablet (15 mg total) by mouth daily. 08/26/13   Reuben Likes, MD  PSEUDOEPHEDRINE HCL PO Take by mouth.    [provider]    Family History History reviewed. No pertinent family history.  Social History Social History   Tobacco Use   Smoking status: Never   Smokeless tobacco: Never  Vaping Use   Vaping status: Never  Used  Substance Use Topics   Alcohol use: No   Drug use: No     Allergies   Peanut-containing drug products, Banana, and Kiwi extract   Review of Systems Review of Systems  Constitutional:  Negative for chills and fever.  Eyes:  Negative for discharge and redness.  Respiratory:  Negative for shortness of breath.   Skin:  Positive for color change and wound.  Neurological:  Negative for numbness.     Physical Exam Triage Vital Signs ED Triage Vitals  Encounter Vitals Group     BP 05/24/23 0850 132/76     Systolic BP Percentile --      Diastolic BP Percentile --      Pulse Rate 05/24/23 0850 65     Resp 05/24/23 0850 18     Temp 05/24/23 0850 97.8 F (36.6 C)     Temp Source 05/24/23 0850 Oral     SpO2 05/24/23 0850 97 %     Weight 05/24/23 0848 296 lb (134.3 kg)     Height 05/24/23 0848 6\' 3"  (1.905 m)     Head Circumference --      Peak Flow --      Pain Score 05/24/23 0845 5     Pain Loc --      Pain Education --  Exclude from Growth Chart --    No data found.  Updated Vital Signs BP 132/76 (BP Location: Left Arm)   Pulse 65   Temp 97.8 F (36.6 C) (Oral)   Resp 18   Ht 6\' 3"  (1.905 m)   Wt 296 lb (134.3 kg)   SpO2 97%   BMI 37.00 kg/m   Visual Acuity Right Eye Distance:   Left Eye Distance:   Bilateral Distance:    Right Eye Near:   Left Eye Near:    Bilateral Near:     Physical Exam Vitals and nursing note reviewed.  Constitutional:      General: He is not in acute distress.    Appearance: Normal appearance. He is not ill-appearing.  HENT:     Head: Normocephalic and atraumatic.  Eyes:     Conjunctiva/sclera: Conjunctivae normal.  Cardiovascular:     Rate and Rhythm: Normal rate.  Pulmonary:     Effort: Pulmonary effort is normal. No respiratory distress.  Musculoskeletal:     Comments: Diffuse erythema and swelling to distal left great toe with mild purulent drainage noted around nail  Neurological:     Mental Status: He is  alert.  Psychiatric:        Mood and Affect: Mood normal.        Behavior: Behavior normal.        Thought Content: Thought content normal.      UC Treatments / Results  Labs (all labs ordered are listed, but only abnormal results are displayed) Labs Reviewed - No data to display  EKG   Radiology No results found.  Procedures Procedures (including critical care time)  Medications Ordered in UC Medications - No data to display  Initial Impression / Assessment and Plan / UC Course  I have reviewed the triage vital signs and the nursing notes.  Pertinent labs & imaging results that were available during my care of the patient were reviewed by me and considered in my medical decision making (see chart for details).    Recommended antibiotic therapy to cover infected great toe with doxycycline and advised warm soaks to promote continued drainage.  Encouraged follow-up if no gradual improvement with any further concerns.  Final Clinical Impressions(s) / UC Diagnoses   Final diagnoses:  Paronychia of toe of left foot due to ingrown toenail   Discharge Instructions   None    ED Prescriptions     Medication Sig Dispense Auth. Provider   doxycycline (VIBRAMYCIN) 100 MG capsule Take 1 capsule (100 mg total) by mouth 2 (two) times daily. 20 capsule Tomi Bamberger, PA-C      PDMP not reviewed this encounter.   Tomi Bamberger, PA-C 06/21/23 [phone]

## 2023-08-07 ENCOUNTER — Encounter: Payer: Self-pay | Admitting: Family

## 2023-08-07 ENCOUNTER — Ambulatory Visit (INDEPENDENT_AMBULATORY_CARE_PROVIDER_SITE_OTHER): Payer: BC Managed Care – PPO | Admitting: Family

## 2023-08-07 VITALS — BP 134/81 | HR 60 | Temp 98.4°F | Ht 75.0 in | Wt 300.0 lb

## 2023-08-07 DIAGNOSIS — Z0000 Encounter for general adult medical examination without abnormal findings: Secondary | ICD-10-CM | POA: Diagnosis not present

## 2023-08-07 DIAGNOSIS — Z7689 Persons encountering health services in other specified circumstances: Secondary | ICD-10-CM

## 2023-08-07 NOTE — Progress Notes (Signed)
  Subjective:    Craig Simmons - 33 y.o. male MRN [id number]  Date of birth: 09-24-1990  HPI  Craig Simmons is to establish care.   Current issues and/or concerns: - None. - States he plans to return at later date for annual physical exam.    ROS per HPI    Health Maintenance:  Health Maintenance Due  Topic Date Due   HIV Screening  Never done   Hepatitis C Screening  Never done   COVID-19 Vaccine (1 - 2024-25 season) Never done     Past Medical History: There are no active problems to display for this patient.     Social History   reports that he has never smoked. He has never used smokeless tobacco. He reports that he does not drink alcohol and does not use drugs.   Family History  family history is not on file.   Medications: reviewed and updated   Objective:   Physical Exam BP 134/81 (BP Location: Right Arm, Patient Position: Sitting)   Pulse 60   Temp 98.4 F (36.9 C) (Oral)   Ht 6' 3 (1.905 m)   Wt 300 lb (136.1 kg)   SpO2 98%   BMI 37.50 kg/m   Physical Exam HENT:     Head: Normocephalic and atraumatic.     Nose: Nose normal.     Mouth/Throat:     Mouth: Mucous membranes are moist.     Pharynx: Oropharynx is clear.  Eyes:     Extraocular Movements: Extraocular movements intact.     Conjunctiva/sclera: Conjunctivae normal.     Pupils: Pupils are equal, round, and reactive to light.  Cardiovascular:     Rate and Rhythm: Normal rate and regular rhythm.     Pulses: Normal pulses.     Heart sounds: Normal heart sounds.  Pulmonary:     Effort: Pulmonary effort is normal.     Breath sounds: Normal breath sounds.  Musculoskeletal:        General: Normal range of motion.     Cervical back: Normal range of motion and neck supple.  Neurological:     General: No focal deficit present.     Mental Status: He is alert and oriented to person, place, and time.  Psychiatric:        Mood and Affect: Mood normal.        Behavior: Behavior normal.        Assessment & Plan:  1. Encounter to establish care (Primary) - Patient presents today to establish care. During the interim follow-up with primary provider as scheduled.  - Return for annual physical examination, labs, and health maintenance. Arrive fasting meaning having no food for at least 8 hours prior to appointment. You may have only water or black coffee. Please take scheduled medications as normal.   Patient was given clear instructions to go to Emergency Department or return to medical center if symptoms don't improve, worsen, or new problems develop.The patient verbalized understanding.  I discussed the assessment and treatment plan with the patient. The patient was provided an opportunity to ask questions and all were answered. The patient agreed with the plan and demonstrated an understanding of the instructions.   The patient was advised to call back or seek an in-person evaluation if the symptoms worsen or if the condition fails to improve as anticipated.    Greig Drones, NP 08/07/2023, 11:42 AM Primary Care at [facility]

## 2023-08-07 NOTE — Progress Notes (Signed)
 No concerns today.  Wanting to get established.

## 2023-11-06 ENCOUNTER — Encounter: Payer: Self-pay | Admitting: Family

## 2023-11-06 ENCOUNTER — Ambulatory Visit (INDEPENDENT_AMBULATORY_CARE_PROVIDER_SITE_OTHER): Payer: BC Managed Care – PPO | Admitting: Family

## 2023-11-06 VITALS — BP 130/87 | HR 66 | Temp 98.1°F | Resp 16 | Ht 75.0 in | Wt 303.0 lb

## 2023-11-06 DIAGNOSIS — Z0000 Encounter for general adult medical examination without abnormal findings: Secondary | ICD-10-CM

## 2023-11-06 DIAGNOSIS — Z114 Encounter for screening for human immunodeficiency virus [HIV]: Secondary | ICD-10-CM

## 2023-11-06 DIAGNOSIS — Z13220 Encounter for screening for lipoid disorders: Secondary | ICD-10-CM

## 2023-11-06 DIAGNOSIS — Z1159 Encounter for screening for other viral diseases: Secondary | ICD-10-CM

## 2023-11-06 DIAGNOSIS — Z131 Encounter for screening for diabetes mellitus: Secondary | ICD-10-CM | POA: Diagnosis not present

## 2023-11-06 DIAGNOSIS — Z130 Encounter for screening for diseases of the blood and blood-forming organs and certain disorders involving the immune mechanism: Secondary | ICD-10-CM | POA: Diagnosis not present

## 2023-11-06 DIAGNOSIS — Z13228 Encounter for screening for other metabolic disorders: Secondary | ICD-10-CM | POA: Diagnosis not present

## 2023-11-06 DIAGNOSIS — Z1329 Encounter for screening for other suspected endocrine disorder: Secondary | ICD-10-CM

## 2023-11-06 NOTE — Progress Notes (Signed)
 Patient ID: Craig Simmons, male    DOB: August 04, 1990  MRN: [id number]  CC: Annual Exam  Subjective: Craig Simmons is a 33 y.o. male who presents for annual exam. He is accompanied by his significant other.  His concerns today include:  None.  There are no active problems to display for this patient.    No current outpatient medications on file prior to visit.   No current facility-administered medications on file prior to visit.    Allergies  Allergen Reactions   Peanut-Containing Drug Products Anaphylaxis   Banana Nausea And Vomiting   Kiwi Extract Swelling    Social History   Socioeconomic History   Marital status: Single    Spouse name: Not on file   Number of children: 0   Years of education: some college   Highest education level: Not on file  Occupational History   Not on file  Tobacco Use   Smoking status: Never   Smokeless tobacco: Never  Vaping Use   Vaping status: Never Used  Substance and Sexual Activity   Alcohol use: No   Drug use: No   Sexual activity: Not Currently  Other Topics Concern   Not on file  Social History Narrative   Not on file   Social Drivers of Health   Financial Resource Strain: Low Risk  (08/07/2023)   Overall Financial Resource Strain (CARDIA)    Difficulty of Paying Living Expenses: Not hard at all  Food Insecurity: No Food Insecurity (08/07/2023)   Hunger Vital Sign    Worried About Running Out of Food in the Last Year: Never true    Ran Out of Food in the Last Year: Never true  Transportation Needs: No Transportation Needs (08/07/2023)   PRAPARE - Administrator, Civil Service (Medical): No    Lack of Transportation (Non-Medical): No  Physical Activity: Sufficiently Active (08/07/2023)   Exercise Vital Sign    Days of Exercise per Week: 3 days    Minutes of Exercise per Session: 70 min  Stress: No Stress Concern Present (08/07/2023)   [employer] of Occupational Health - Occupational Stress  Questionnaire    Feeling of Stress : Not at all  Social Connections: Socially Isolated (08/07/2023)   Social Connection and Isolation Panel [NHANES]    Frequency of Communication with Friends and Family: Twice a week    Frequency of Social Gatherings with Friends and Family: Twice a week    Attends Religious Services: Never    Database administrator or Organizations: No    Attends Banker Meetings: Never    Marital Status: Never married  Intimate Partner Violence: Not At Risk (08/07/2023)   Humiliation, Afraid, Rape, and Kick questionnaire    Fear of Current or Ex-Partner: No    Emotionally Abused: No    Physically Abused: No    Sexually Abused: No    History reviewed. No pertinent family history.  History reviewed. No pertinent surgical history.  ROS: Review of Systems Negative except as stated above  PHYSICAL EXAM: BP 130/87   Pulse 66   Temp 98.1 F (36.7 C) (Oral)   Resp 16   Ht 6\' 3"  (1.905 m)   Wt (!) 303 lb (137.4 kg)   SpO2 95%   BMI 37.87 kg/m   Physical Exam HENT:     Head: Normocephalic and atraumatic.     Right Ear: Tympanic membrane, ear canal and external ear normal.     Left  Ear: Tympanic membrane, ear canal and external ear normal.     Nose: Nose normal.     Mouth/Throat:     Mouth: Mucous membranes are moist.     Pharynx: Oropharynx is clear.  Eyes:     Extraocular Movements: Extraocular movements intact.     Conjunctiva/sclera: Conjunctivae normal.     Pupils: Pupils are equal, round, and reactive to light.  Neck:     Thyroid: No thyroid mass, thyromegaly or thyroid tenderness.  Cardiovascular:     Rate and Rhythm: Normal rate and regular rhythm.     Pulses: Normal pulses.     Heart sounds: Normal heart sounds.  Pulmonary:     Effort: Pulmonary effort is normal.     Breath sounds: Normal breath sounds.  Abdominal:     General: Bowel sounds are normal.     Palpations: Abdomen is soft.  Genitourinary:    Comments: Patient  declined. Musculoskeletal:        General: Normal range of motion.     Right shoulder: Normal.     Left shoulder: Normal.     Right upper arm: Normal.     Left upper arm: Normal.     Right elbow: Normal.     Left elbow: Normal.     Right forearm: Normal.     Left forearm: Normal.     Right wrist: Normal.     Left wrist: Normal.     Right hand: Normal.     Left hand: Normal.     Cervical back: Normal, normal range of motion and neck supple.     Thoracic back: Normal.     Lumbar back: Normal.     Right hip: Normal.     Left hip: Normal.     Right upper leg: Normal.     Left upper leg: Normal.     Right knee: Normal.     Left knee: Normal.     Right lower leg: Normal.     Left lower leg: Normal.     Right ankle: Normal.     Left ankle: Normal.     Right foot: Normal.     Left foot: Normal.  Skin:    General: Skin is warm and dry.     Capillary Refill: Capillary refill takes less than 2 seconds.  Neurological:     General: No focal deficit present.     Mental Status: He is alert and oriented to person, place, and time.  Psychiatric:        Mood and Affect: Mood normal.        Behavior: Behavior normal.    ASSESSMENT AND PLAN: 1. Annual physical exam (Primary) - Counseled on 150 minutes of exercise per week as tolerated, healthy eating (including decreased daily intake of saturated fats, cholesterol, added sugars, sodium), STI prevention, and routine healthcare maintenance.  2. Screening for metabolic disorder - Routine screening.  - CMP14+EGFR  3. Screening for deficiency anemia - Routine screening.  - CBC  4. Diabetes mellitus screening - Routine screening.  - Hemoglobin A1c  5. Screening cholesterol level - Routine screening.  - Lipid panel  6. Thyroid disorder screen - Routine screening.  - TSH  7. Encounter for screening for HIV - Routine screening.  - HIV antibody (with reflex)  8. Need for hepatitis C screening test - Routine screening.  -  Hepatitis C Antibody    Patient was given the opportunity to ask questions.  Patient verbalized understanding of  the plan and was able to repeat key elements of the plan. Patient was given clear instructions to go to Emergency Department or return to medical center if symptoms don't improve, worsen, or new problems develop.The patient verbalized understanding.   Orders Placed This Encounter  Procedures   HIV antibody (with reflex)   Hepatitis C Antibody   CBC   Lipid panel   CMP14+EGFR   Hemoglobin A1c   TSH    Return in about 1 year (around 11/05/2024) for Physical per patient preference.  Rema Fendt, NP

## 2023-11-06 NOTE — Progress Notes (Signed)
 -  Patient is here to have annually  complete physical examination  -Care gap address -labs taken

## 2023-11-07 ENCOUNTER — Other Ambulatory Visit: Payer: Self-pay | Admitting: Family

## 2023-11-07 ENCOUNTER — Encounter: Payer: Self-pay | Admitting: Family

## 2023-11-07 DIAGNOSIS — Z13220 Encounter for screening for lipoid disorders: Secondary | ICD-10-CM

## 2023-11-07 LAB — LIPID PANEL
Chol/HDL Ratio: 4.2 ratio (ref 0.0–5.0)
Cholesterol, Total: 229 mg/dL — ABNORMAL HIGH (ref 100–199)
HDL: 54 mg/dL
LDL Chol Calc (NIH): 158 mg/dL — ABNORMAL HIGH (ref 0–99)
Triglycerides: 97 mg/dL (ref 0–149)
VLDL Cholesterol Cal: 17 mg/dL (ref 5–40)

## 2023-11-07 LAB — CBC
Hematocrit: 42.3 % (ref 37.5–51.0)
Hemoglobin: 13.4 g/dL (ref 13.0–17.7)
MCH: 28.1 pg (ref 26.6–33.0)
MCHC: 31.7 g/dL (ref 31.5–35.7)
MCV: 89 fL (ref 79–97)
Platelets: 240 10*3/uL (ref 150–450)
RBC: 4.77 x10E6/uL (ref 4.14–5.80)
RDW: 13.1 % (ref 11.6–15.4)
WBC: 6.1 10*3/uL (ref 3.4–10.8)

## 2023-11-07 LAB — CMP14+EGFR
ALT: 21 IU/L (ref 0–44)
AST: 16 IU/L (ref 0–40)
Albumin: 4.5 g/dL (ref 4.1–5.1)
Alkaline Phosphatase: 123 IU/L — ABNORMAL HIGH (ref 44–121)
BUN/Creatinine Ratio: 13 (ref 9–20)
BUN: 13 mg/dL (ref 6–20)
Bilirubin Total: <0.2 mg/dL (ref 0.0–1.2)
CO2: 21 mmol/L (ref 20–29)
Calcium: 9.3 mg/dL (ref 8.7–10.2)
Chloride: 103 mmol/L (ref 96–106)
Creatinine, Ser: 1 mg/dL (ref 0.76–1.27)
Globulin, Total: 2.8 g/dL (ref 1.5–4.5)
Glucose: 101 mg/dL — ABNORMAL HIGH (ref 70–99)
Potassium: 4.5 mmol/L (ref 3.5–5.2)
Sodium: 140 mmol/L (ref 134–144)
Total Protein: 7.3 g/dL (ref 6.0–8.5)
eGFR: 101 mL/min/{1.73_m2} (ref >59)

## 2023-11-07 LAB — HEMOGLOBIN A1C
Est. average glucose Bld gHb Est-mCnc: 131 mg/dL
Hgb A1c MFr Bld: 6.2 % — ABNORMAL HIGH (ref 4.8–5.6)

## 2023-11-07 LAB — HEPATITIS C ANTIBODY: Hep C Virus Ab: NONREACTIVE

## 2023-11-07 LAB — TSH: TSH: 1.13 u[IU]/mL (ref 0.450–4.500)

## 2023-11-07 LAB — HIV ANTIBODY (ROUTINE TESTING W REFLEX): HIV Screen 4th Generation wRfx: NONREACTIVE

## 2024-11-05 ENCOUNTER — Encounter: Admitting: Family
